# Patient Record
Sex: Male | Born: 1955 | Race: White | Hispanic: Refuse to answer | Marital: Married | Smoking: Current every day smoker
Health system: Southern US, Community
[De-identification: ages and names within clinical notes are randomized; demographics above are authoritative.]

## PROBLEM LIST (undated history)

## (undated) DIAGNOSIS — J189 Pneumonia, unspecified organism: Secondary | ICD-10-CM

## (undated) HISTORY — PX: FINGER SURGERY: SHX640

## (undated) HISTORY — DX: Pneumonia, unspecified organism: J18.9

## (undated) HISTORY — PX: HERNIA REPAIR: SHX51

---

## 2014-09-18 ENCOUNTER — Ambulatory Visit: Payer: Self-pay | Admitting: Surgery

## 2014-09-18 LAB — BASIC METABOLIC PANEL
ANION GAP: 5 — AB (ref 7–16)
BUN: 16 mg/dL (ref 7–18)
CALCIUM: 8.8 mg/dL (ref 8.5–10.1)
CO2: 29 mmol/L (ref 21–32)
CREATININE: 0.89 mg/dL (ref 0.60–1.30)
Chloride: 106 mmol/L (ref 98–107)
EGFR (Non-African Amer.): >60
Glucose: 99 mg/dL (ref 65–99)
Osmolality: 281 (ref 275–301)
POTASSIUM: 4.5 mmol/L (ref 3.5–5.1)
Sodium: 140 mmol/L (ref 136–145)

## 2014-09-18 LAB — CBC WITH DIFFERENTIAL/PLATELET
BASOS ABS: 0 10*3/uL (ref 0.0–0.1)
BASOS PCT: 0.5 %
EOS PCT: 3.8 %
Eosinophil #: 0.3 10*3/uL (ref 0.0–0.7)
HCT: 46.7 % (ref 40.0–52.0)
HGB: 15.5 g/dL (ref 13.0–18.0)
LYMPHS ABS: 2.2 10*3/uL (ref 1.0–3.6)
LYMPHS PCT: 28.5 %
MCH: 30.1 pg (ref 26.0–34.0)
MCHC: 33.1 g/dL (ref 32.0–36.0)
MCV: 91 fL (ref 80–100)
MONOS PCT: 7.7 %
Monocyte #: 0.6 x10 3/mm (ref 0.2–1.0)
Neutrophil #: 4.5 10*3/uL (ref 1.4–6.5)
Neutrophil %: 59.5 %
PLATELETS: 237 10*3/uL (ref 150–440)
RBC: 5.15 10*6/uL (ref 4.40–5.90)
RDW: 14.4 % (ref 11.5–14.5)
WBC: 7.6 10*3/uL (ref 3.8–10.6)

## 2014-09-27 ENCOUNTER — Ambulatory Visit: Payer: Self-pay | Admitting: Surgery

## 2015-02-16 NOTE — Op Note (Signed)
PATIENT NAME:  Bradley Harding, Cotton C MR#:  [id number] DATE OF BIRTH:  07-26-1956  DATE OF PROCEDURE:  09/27/2014  PREOPERATIVE DIAGNOSIS: Right inguinal hernia, recurrent.   POSTOPERATIVE DIAGNOSIS: Direct right inguinal hernia, recurrent.   PROCEDURE PERFORMED: Da Vinci robot-assisted laparoscopic right inguinal hernia repair with mesh.   SURGEON: Ida Roguehristopher Gerrianne Aydelott, MD  ANESTHESIA: General.   ESTIMATED BLOOD LOSS: Minimal.  COMPLICATIONS: None.   SPECIMENS: None.   INDICATION FOR SURGERY: Mr. Lovell SheehanJenkins a pleasant 59 year old male who has a history of right inguinal hernia repair, which was open, who presents with a right groin bulge and pain. He was brought to the operating room suite for robot-assisted laparoscopic inguinal hernia repair.   DETAILS OF PROCEDURE: Informed consent was obtained. Mr. Lovell SheehanJenkins was brought to the operating room suite. He was induced. Endotracheal tube was placed. General anesthesia was administered. His abdomen was prepped and draped in standard surgical fashion. A timeout was then performed to correctly identify patient name, operative site and procedure to be performed. A supraumbilical incision was made, deepened down to the fascia. The fascia was incised. Peritoneum was entered. Two stay sutures were placed in the fasciotomy. A Hassan trocar was placed in the abdomen, and the abdomen was insufflated. There were some adhesions around the umbilicus. Therefore, carefully an 8 mm Da Vinci trocar x2 were placed at approximately 8 cm lateral to the previous incision, at the umbilical line, on the left and right sides. An assistant port was then placed in the left upper quadrant between the lateral most and midline trocars. The robot was undocked. The peritoneum was then incised. I then looked at the left inguinal area and  there was no hernia. I then looked at the right and there was a large direct hernia in which the appendix had actually gone. The peritoneum was then  taken down until all hernia defects were visualized. I then placed a piece of 3-D mesh, medium-sized, in the defect. This was stapled to Cooper's and to the anterior abdominal wall. I then sutured the peritoneum to close the peritoneum over the mesh. Robot was then undocked. The supraumbilical fascia was closed with figure-of-eight 0 Vicryl. The 10 mm assistant trocar was then closed with figure-of-eight 0 Vicryl. The skin was then closed, after infiltration with 1% lidocaine, with interrupted 4-0 Monocryl deep dermal sutures. Steri-Strips, Telfa gauze and Tegaderm were then placed over the wound. The patient was then awoken, extubated and brought to the postanesthesia care unit. There were no immediate complications. Needle, sponge, and instrument count was correct at the end of the procedure.  ____________________________ Si Raiderhristopher A. Kaladin Noseworthy, MD cal:sb D: 09/27/2014 09:48:40 ET T: 09/27/2014 13:49:38 ET JOB#: [id number]  cc: Cristal Deerhristopher A. Jonty Morrical, MD, <Dictator> Jarvis NewcomerHRISTOPHER A Devita Nies MD ELECTRONICALLY SIGNED 10/01/2014 20:11

## 2016-05-01 ENCOUNTER — Institutional Professional Consult (permissible substitution): Payer: Self-pay | Admitting: Internal Medicine

## 2016-05-12 ENCOUNTER — Encounter: Payer: Self-pay | Admitting: Internal Medicine

## 2016-05-12 ENCOUNTER — Ambulatory Visit (INDEPENDENT_AMBULATORY_CARE_PROVIDER_SITE_OTHER): Payer: 59 | Admitting: Internal Medicine

## 2016-05-12 VITALS — BP 130/62 | HR 71 | Ht 74.0 in | Wt 149.0 lb

## 2016-05-12 DIAGNOSIS — Z716 Tobacco abuse counseling: Secondary | ICD-10-CM | POA: Insufficient documentation

## 2016-05-12 DIAGNOSIS — J984 Other disorders of lung: Secondary | ICD-10-CM

## 2016-05-12 DIAGNOSIS — R0600 Dyspnea, unspecified: Secondary | ICD-10-CM | POA: Diagnosis not present

## 2016-05-12 DIAGNOSIS — J941 Fibrothorax: Secondary | ICD-10-CM

## 2016-05-12 DIAGNOSIS — J90 Pleural effusion, not elsewhere classified: Secondary | ICD-10-CM | POA: Diagnosis not present

## 2016-05-12 DIAGNOSIS — J929 Pleural plaque without asbestos: Secondary | ICD-10-CM | POA: Insufficient documentation

## 2016-05-12 NOTE — Assessment & Plan Note (Signed)
I suspect this is a reactive pleural effusion. At this time he does have clinical improvement, so I do not think that it is actively infected. Based on the report is in the left lower lobe, size has not been tract arise. Given his clinical improvement, at this time there is no need for an emergent/urgent thoracentesis.  Plan: -Continue with supportive care -Further workup pending clinical status and review of CAT scan images from CD once it is available

## 2016-05-12 NOTE — Assessment & Plan Note (Signed)
Multifactorial: Recent pneumonia, recurrent upper respiratory tract infection, COPD  Patient states that his breathing is actually improving over the past couple weeks, he does not endorse any significant coughing or significant shortness of breath. 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  Plan: -Pulmonary function testing, 6 minute walk test -Albuterol as needed -Further recommendations based on PFTs

## 2016-05-12 NOTE — Assessment & Plan Note (Signed)
Right apical pleural thickening, most likely a sequelae of COPD/emphysema. Infection can also cause an inflammatory response such as this.  Plan: -Continue with supportive care and surveillance -Pulmonary function testing -6 minute walk test -Further workup pending review of CT images from CD

## 2016-05-12 NOTE — Assessment & Plan Note (Signed)
Differential includes: Infection, vasculitis, malignancy.  Based on CT reports, this may be an infective process as it such an acute finding over a two-month period. Malignancy is still in differential however I suspect bacterial infection/pneumonia may be the cause of his cavitary finding on his most recent CAT scan. He does have poor dentition and has not followed up with a dentist in over a year. Further history reveals that he continues to have recurrent upper spray tract infections around the same time for the past 3-4 years which is usually spring.  At this time I do not have access to the actual CT images to evaluate radiographically the cavitary lesion or the pleural effusion. I have requested electronic transfer of images to our system however this is not possible, and a physical CD will be obtained [facility].  Based on review of images, a bronchoscopy may be considered.  At this time the patient states that his breathing is almost back to baseline, he doesn't have any significant cough, fever, chills. Cavitary area in the left upper lobe may be a sequelae of his recent pneumonia again however we'll await images to be reviewed before any further invasive procedures are performed. At this time another CAT scan is not warranted as it will not show much difference.  Plan: -Supportive care -Follow-up with dentistry -Request for [facility] to obtain a CD of the images -Further workup pending review of images, once CD is delivered

## 2016-05-12 NOTE — Patient Instructions (Signed)
Follow up with Dr. Dema SeverinMungal in:6 weeks - PFTs and 6MWT prior to follow up visit - cut back on any tobacco use - avoid ecigs, vapors, etc - cont with as needed use of albuterol inhaler for sob/wheezing/coughing - I will review your CT chest images once he become available, and our office will call you about possible bronchoscopy or further evaluation for left upper lobe cavitary lesion and left-sided pleural effusion.

## 2016-05-12 NOTE — Progress Notes (Signed)
[facility] Pulmonary Medicine Consultation    Date: 05/12/2016  MRN# [id number] Bradley Harding 12/27/1955  Referring Physician: Dr. Vinson Moselle PMD - Dr. Patrice Paradise [name]. is a 60 y.o. old male seen in consultation for   CC:  Chief Complaint  Patient presents with  . pulmonary consult    pt ref by Dr. Vinson Moselle for pna. hosp visit to [facility]mo ago dx w/PNA. c/o lt rib pain, occ sob, occ prod cough w/clear mucus X3-66mo    HPI:  Patient is a pleasant 60 year old male seen in consultation for left upper lobe cavitary lesion and left sided pleural effusion. History as stated below, patient noted to have cough, shortness of breath, fever and chills for one month during April 2017, follow-up with his primary care physician diagnosed with [facility] pneumonia and placed on Levaquin and prednisone. He had mild improvement however in May he had a relapse of his symptoms with intense shortness of breath and left-sided chest wall pain, he presented to the ER chest x-ray showed left upper lobe pneumonia, CT of the chest showed left upper lobe consolidated pneumonia with left-sided pleural effusion, he was started on prednisone a Z-Pak at that time. In June 2017 he continue to follow-up his cough improved, it was no longer productive it was thin and clear, he continues to have some shortness of breath but again it is significantly improved. A follow-up chest CT was done that showed a left upper lobe cavitary lesion along with increasing left-sided pleural effusion. He was then referred to pulmonary for further workup and evaluation. He states he is a current smoker, he smoked different types of tobacco over his lifetime. He initially smoked cigarettes for about 15 years 1.5 packs per day quit in 1995, Dennis 2005 started smoking cigars about 1 cigar a day and quit in 2014. Finally in 2014 started smoking a pipe withdrawal tobacco, smoke about 4-5 times per day of his pipe. Today he denies any  significant shortness of breath, cough, weight loss, night sweats. He does endorse left-sided chest wall tenderness with deep inspiration.  Review of chart by Dr. Dema Severin PMD Visit 04/17/16 patient is seen today for follow-up of community-acquired pneumonia and pleuritic chest pain. Patient was initially seen in May 2017. His antibiotic regimen has included Zithromax, Levaquin and more recently Ceftin your antibiotic course. Follow-up chest CT revealed extending left lung consolidation and concern of ongoing infection, possible TB or atypical infection vs malignancy. He was referred to [facility] pulmonary for ASAP evaluation. Pt has not heard about pulmonary appt as of yet but he is feeling better. Cough has decreased in frequency and productivity and he is having no fever, night sweats and SOB is improved too. He is still smoking his pipe daily, up to 3 times a day. The pt states is eating more and back to normal and his weight has stablelized. The pt denies known exposure to TB. Negative PPD  Assessment/Plan:  Atypical pneumonia with worrisome findings on repeat Chest CT. He has been referred for ASAP pulmonary referral and we will check on progress of this referral. PPD placed today and will be read in 72 hrs. He is subjectively improved. He is strongly advised to wean from his current tobacco use.     PMHX:   Past Medical History  Diagnosis Date  . Pneumonia    Surgical Hx:  Past Surgical History  Procedure Laterality Date  . Finger surgery    . Hernia repair  Family Hx:  Family History  Problem Relation Age of Onset  . Lung cancer Mother   . Cancer Father    Social Hx:   Social History  Substance Use Topics  . Smoking status: Current Every Day Smoker -- 2 years    Types: Pipe  . Smokeless tobacco: None  . Alcohol Use: Yes     Comment: occ   Medication:   Current Outpatient Rx  Name  Route  Sig  Dispense  Refill  . PROAIR HFA 108 (90 Base) MCG/ACT inhaler                  Dispense as written.       Allergies:  Review of patient's allergies indicates not on file.  Review of Systems  Constitutional: Negative for fever, weight loss and malaise/fatigue.  HENT: Negative for sore throat.   Eyes: Negative for blurred vision and double vision.  Respiratory: Positive for cough and shortness of breath. Negative for wheezing and stridor.        Left chest wall tenderness with deep breathing  Cardiovascular: Negative for chest pain.  Gastrointestinal: Negative for heartburn.  Genitourinary: Negative for dysuria.  Musculoskeletal: Negative for myalgias.  Skin: Negative for rash.  Endo/Heme/Allergies: Does not bruise/bleed easily.  Psychiatric/Behavioral: Negative for depression.     Physical Examination:   VS: BP 130/62 mmHg  Pulse 71  Ht 6\' 2"  (1.88 m)  Wt 149 lb (67.586 kg)  BMI 19.12 kg/m2  SpO2 96%  General Appearance: No distress  Neuro:without focal findings, mental status, speech normal, alert and oriented, cranial nerves 2-12 intact, reflexes normal and symmetric, sensation grossly normal  HEENT: PERRLA, EOM intact, no ptosis, no other lesions noticed; Mallampati 2. Poor dentition Pulmonary: normal breath sounds., diaphragmatic excursion normal.No wheezing, No rales;   Sputum Production:  none CardiovascularNormal S1,S2.  No m/r/g.  Abdominal aorta pulsation normal.    Abdomen: Benign, Soft, non-tender, No masses, hepatosplenomegaly, No lymphadenopathy Renal:  No costovertebral tenderness  GU:  No performed at this time. Endoc: No evident thyromegaly, no signs of acromegaly or Cushing features Skin:   warm, no rashes, no ecchymosis  Extremities: normal, no cyanosis, clubbing, no edema, warm with normal capillary refill. Other findings:none   Labs results:   Rad results: (The following images and results were reviewed by Dr. Dema SeverinMungal on 05/12/2016). CXR 08/2014 EXAM:  CHEST 2 VIEW   COMPARISON: None.   FINDINGS:  Mediastinum and  hilar structures are normal. Mild pectus deformity.  No focal infiltrate. No pleural effusion or pneumothorax. No acute  bony abnormality.   IMPRESSION:  Mild pectus deformity, no acute cardiopulmonary disease.    CT Chest 04/13/2016 EXAM: CT Chest with contrast  DATE: 04/13/2016 8:16 AM ACCESSION: [id number] UN DICTATED: 04/13/2016 8:26 AM INTERPRETATION LOCATION: [facility]  CLINICAL INDICATION: 60 years old Male with -J18.9-CAP (community acquired pneumonia)  persistent pneumonia symptoms.  COMPARISON: CTA chest from 02/29/2016.  TECHNIQUE: A spiral CT scan was obtained with IV contrast from the thoracic inlet through the hemidiaphragms. Coronal and Sagittal reformatted images of the chest were also provided. 5mm and 2 mm reconstructed slices obtained.    FINDINGS:   AIRWAYS, LUNGS, PLEURA: Emphysema. Peribronchial thickening. Interval development of cavitary left upper lobe lesion with surrounding peripheral consolidations extending to the pleura Increased right apical pleural thickening is indeterminate. Increased retraction of the left hilum and left upper lobe volume loss.  Small left pleural effusion is new since 02/29/2016.  MEDIASTINUM: Normal heart size. The  left vertebral artery arises directly off the aortic arch. No pericardial effusion.  Enlarged right hilar node measures 1.9 cm.  IMAGED ABDOMEN: Unremarkable.  SOFT TISSUE: Unremarkable.  BONES: Unremarkable.   IMPRESSION: Interval development of cavitary left upper lobe lesion with surrounding peripheral consolidations extending to the pleura and increased left apical pleural thickening may represent ongoing infection. Consider tuberculosis, atypical mycobacterial infection, aspergillosis, and actinomycosis. Consider bronchial sampling.  Short-term follow-up chest CT is recommended to exclude neoplastic process.   New left pleural effusion.   CTA Chest 02/2016 EXAM: CTA Chest for Pulmonary  Embolus DATE: 02/29/2016 10:17 AM ACCESSION: [id number] UN DICTATED: 02/29/2016 10:36 AM INTERPRETATION LOCATION: [facility]  CLINICAL INDICATION: 60 years old Male with shortness of breath--    COMPARISON: Same-day chest radiograph  TECHNIQUE: A spiral CTA scan was obtained with IV contrast from the thoracic inlet through the hemidiaphragms. Images were reconstructed in the axial plane. Multiplanar reformatted and MIP images are provided for further evaluation of the pulmonary vasculature.  FINDINGS:  There are no filling defects within the pulmonary arterial tree to suggest a pulmonary embolism.  The heart and great vessels have an unremarkable enhanced appearance. Incidentally noted aortic arch origination of the left vertebral artery.  Subcentimeter mediastinal lymph nodes are nonspecific.  Emphysema. Peribronchial thickening.     Multiple impacted left upper lobe airways. Patchy consolidation throughout the left upper lobe.  There are no pleural or pericardial effusions.   The partially visualized upper abdomen has an unremarkable arterial enhanced appearance.   No acute osseous abnormality.    IMPRESSION: -No evidence for pulmonary embolus.  Multiple impacted left upper lobe airways. Patchy consolidation throughout the left upper lobe. Favor pneumonia.   Subcentimeter mediastinal nodes are likely reactive. Follow-up chest CT to confirm resolution in 4-6 weeks.  -Diffuse emphysematous changes. Peribronchial thickening.     Assessment and Plan: 60 year old male past medical history of COPD, tobacco abuse, seen in consultation for left upper lobe cavitary lung lesion along with left pleural effusion Tobacco abuse counseling Tobacco Cessation - Counseling regarding benefits of smoking cessation strategies was provided for more than 12 min. - Educated that at this time smoking- cessation represents the single most important step that patient can take to enhance the length and  quality of live. - Educated patient regarding alternatives of behavior interventions, pharmacotherapy including NRT and non-nicotine therapy such, and combinations of both. - Patient at this time: is smoking pipes, with raw tobacco, he is planning to cut back and quit.     Cavitary lesion of lung Differential includes: Infection, vasculitis, malignancy.  Based on CT reports, this may be an infective process as it such an acute finding over a two-month period. Malignancy is still in differential however I suspect bacterial infection/pneumonia may be the cause of his cavitary finding on his most recent CAT scan. He does have poor dentition and has not followed up with a dentist in over a year. Further history reveals that he continues to have recurrent upper spray tract infections around the same time for the past 3-4 years which is usually spring.  At this time I do not have access to the actual CT images to evaluate radiographically the cavitary lesion or the pleural effusion. I have requested electronic transfer of images to our system however this is not possible, and a physical CD will be obtained from the [facility] system.  Based on review of images, a bronchoscopy may be considered.  At this time the patient states that his  breathing is almost back to baseline, he doesn't have any significant cough, fever, chills. Cavitary area in the left upper lobe may be a sequelae of his recent pneumonia again however we'll await images to be reviewed before any further invasive procedures are performed. At this time another CAT scan is not warranted as it will not show much difference.  Plan: -Supportive care -Follow-up with dentistry -Request for release of medical records from [facility] to obtain a CD of the images -Further workup pending review of images, once CD is delivered  Dyspnea Multifactorial: Recent pneumonia, recurrent upper respiratory tract infection, COPD  Patient states that his  breathing is actually improving over the past couple weeks, he does not endorse any significant coughing or significant shortness of breath. 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  Plan: -Pulmonary function testing, 6 minute walk test -Albuterol as needed -Further recommendations based on PFTs  Pleural thickening Right apical pleural thickening, most likely a sequelae of COPD/emphysema. Infection can also cause an inflammatory response such as this.  Plan: -Continue with supportive care and surveillance -Pulmonary function testing -6 minute walk test -Further workup pending review of CT images from CD  Pleural effusion I suspect this is a reactive pleural effusion. At this time he does have clinical improvement, so I do not think that it is actively infected. Based on the report is in the left lower lobe, size has not been tract arise. Given his clinical improvement, at this time there is no need for an emergent/urgent thoracentesis.  Plan: -Continue with supportive care -Further workup pending clinical status and review of CAT scan images from CD once it is available    Updated Medication List Outpatient Encounter Prescriptions as of 05/12/2016  Medication Sig  . PROAIR HFA 108 (90 Base) MCG/ACT inhaler    No facility-administered encounter medications on file as of 05/12/2016.    Orders for this visit: Orders Placed This Encounter  Procedures  . Pulmonary function test    Standing Status: Future     Number of Occurrences:      Standing Expiration Date: 05/12/2017    Order Specific Question:  Where should this test be performed?    Answer:  [facility]    Order Specific Question:  Full PFT: includes the following: basic spirometry, spirometry pre & post bronchodilator, diffusion capacity (DLCO), lung volumes    Answer:  Full PFT  . 6 minute walk    Standing Status: Future     Number of  Occurrences:      Standing Expiration Date: 05/12/2017    Order Specific Question:  Where should this test be performed?    Answer:  Other     Thank  you for the consultation and for allowing [facility] to assist in the care of your patient. Our recommendations are noted above.  Please contact us if we can be of further service.   Stephanie Acre, MD [facility] Office Number: [phone]  Note: This note was prepared with Dragon dictation along with smaller phrase technology. Any transcriptional errors that result from this process are unintentional.

## 2016-05-12 NOTE — Assessment & Plan Note (Signed)
Tobacco Cessation - Counseling regarding benefits of smoking cessation strategies was provided for more than 12 min. - Educated that at this time smoking- cessation represents the single most important step that patient can take to enhance the length and quality of live. - Educated patient regarding alternatives of behavior interventions, pharmacotherapy including NRT and non-nicotine therapy such, and combinations of both. - Patient at this time: is smoking pipes, with raw tobacco, he is planning to cut back and quit.

## 2016-06-16 ENCOUNTER — Ambulatory Visit (INDEPENDENT_AMBULATORY_CARE_PROVIDER_SITE_OTHER): Payer: 59 | Admitting: *Deleted

## 2016-06-16 ENCOUNTER — Ambulatory Visit: Payer: 59

## 2016-06-16 DIAGNOSIS — R0600 Dyspnea, unspecified: Secondary | ICD-10-CM

## 2016-06-16 DIAGNOSIS — J90 Pleural effusion, not elsewhere classified: Secondary | ICD-10-CM

## 2016-06-16 LAB — PULMONARY FUNCTION TEST
DL/VA % pred: 75 %
DL/VA: 3.64 ml/min/mmHg/L
DLCO UNC % PRED: 56 %
DLCO unc: 21.13 ml/min/mmHg
FEF 25-75 PRE: 0.8 L/s
FEF 25-75 Post: 1.14 L/sec
FEF2575-%Change-Post: 43 %
FEF2575-%Pred-Post: 34 %
FEF2575-%Pred-Pre: 23 %
FEV1-%Change-Post: 15 %
FEV1-%Pred-Post: 47 %
FEV1-%Pred-Pre: 41 %
FEV1-Post: 1.96 L
FEV1-Pre: 1.69 L
FEV1FVC-%Change-Post: 1 %
FEV1FVC-%Pred-Pre: 68 %
FEV6-%CHANGE-POST: 14 %
FEV6-%PRED-PRE: 61 %
FEV6-%Pred-Post: 69 %
FEV6-Post: 3.64 L
FEV6-Pre: 3.19 L
FEV6FVC-%Change-Post: 0 %
FEV6FVC-%PRED-POST: 101 %
FEV6FVC-%PRED-PRE: 101 %
FVC-%CHANGE-POST: 14 %
FVC-%Pred-Post: 68 %
FVC-%Pred-Pre: 59 %
FVC-Post: 3.74 L
FVC-Pre: 3.26 L
POST FEV1/FVC RATIO: 53 %
POST FEV6/FVC RATIO: 97 %
Pre FEV1/FVC ratio: 52 %
Pre FEV6/FVC Ratio: 98 %
RV % PRED: 179 %
RV: 4.42 L
TLC % PRED: 100 %
TLC: 7.8 L

## 2016-06-16 NOTE — Progress Notes (Signed)
SMW performed today. 

## 2016-06-16 NOTE — Progress Notes (Signed)
PFT performed today. 

## 2016-06-23 ENCOUNTER — Encounter: Payer: Self-pay | Admitting: Internal Medicine

## 2016-06-23 ENCOUNTER — Ambulatory Visit (INDEPENDENT_AMBULATORY_CARE_PROVIDER_SITE_OTHER): Payer: 59 | Admitting: Internal Medicine

## 2016-06-23 VITALS — BP 126/74 | HR 71 | Ht 74.0 in | Wt 145.2 lb

## 2016-06-23 DIAGNOSIS — R0600 Dyspnea, unspecified: Secondary | ICD-10-CM | POA: Diagnosis not present

## 2016-06-23 DIAGNOSIS — J984 Other disorders of lung: Secondary | ICD-10-CM | POA: Diagnosis not present

## 2016-06-23 DIAGNOSIS — J449 Chronic obstructive pulmonary disease, unspecified: Secondary | ICD-10-CM | POA: Insufficient documentation

## 2016-06-23 DIAGNOSIS — Z716 Tobacco abuse counseling: Secondary | ICD-10-CM | POA: Diagnosis not present

## 2016-06-23 MED ORDER — GLYCOPYRROLATE-FORMOTEROL 9-4.8 MCG/ACT IN AERO: 2.0000 | INHALATION_SPRAY | Freq: Two times a day (BID) | RESPIRATORY_TRACT | 0 refills | Status: AC

## 2016-06-23 NOTE — Assessment & Plan Note (Signed)
Patient with severe obstruction noted on his primary function testing, FEV1/FVC 52%, FEV1 41% is also notable hyperinflation and air trapping. Today we started the patient on a LABA/LAMA - Bevespi Given his clinical symptoms and his level of obstruction he is a stage II or III COPD patient.  Plan: -Bevespi - 2 puff BID. -gargle and rinse after each use.

## 2016-06-23 NOTE — Assessment & Plan Note (Signed)
Differential includes: Infection, vasculitis, malignancy.  Based on CT reports, this may be an infective process as it such an acute finding over a two-month period. Malignancy is still in differential however I suspect bacterial infection/pneumonia may be the cause of his cavitary finding on his most recent CAT scan. He does have poor dentition and has not followed up with a dentist in over a year.  CT chest with LUL cavitary lesion in 03/2016 Patient with CD today, images reviewed, LUL cavitary lesion with small L pleural effusion. Given clinical improvement, no bronchoscopy at this, but will require a follow Chest CT  Plan: -Supportive care -Follow-up with dentistry -CT chest w/o contrast prior to follow up visit.

## 2016-06-23 NOTE — Assessment & Plan Note (Signed)
Tobacco Cessation - Counseling regarding benefits of smoking cessation strategies was provided for more than 12 min. - Educated that at this time smoking- cessation represents the single most important step that patient can take to enhance the length and quality of live. - Educated patient regarding alternatives of behavior interventions, pharmacotherapy including NRT and non-nicotine therapy such, and combinations of both. - Patient at this time: is smoking pipes, with raw tobacco, he is planning to cut back and quit.    

## 2016-06-23 NOTE — Patient Instructions (Addendum)
Follow up with Dr. Dema SeverinMungal in:6 weeks -Follow-up with dentistry -CT chest w/o contrast prior to follow up visit.  -Bevespi - 2 puff BID - -gargle and rinse after each use.  - cut back and stop smoking

## 2016-06-23 NOTE — Assessment & Plan Note (Signed)
Multifactorial: Recent pneumonia, recurrent upper respiratory tract infection, COPD  Patient states that his breathing is actually improving over the past couple weeks, he does not endorse any significant coughing or significant shortness of breath. 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  Plan: -Albuterol as needed - see Plan for COPD

## 2016-06-23 NOTE — Progress Notes (Signed)
[name]      MRN# [id number] Bradley Harding. Feb 19, 1956   CC: Chief Complaint  Patient presents with  . Follow-up    review PFT and 6mw.  Pt states he is much improved since last OV, dyspnea has improved.        Brief History: 60 yo former tobacco smoker, now smokes cigars and pipe, noted to have community acquired pneumonia with left upper lobe cavitary lesion. FEV1 41%, FEV1/FVC 52%. Started on trial of Bevespi   Events since last clinic visit: patient presented today for follow-up visit of his left upper lobe cavitary lesion. He had a primary function tests and 6 minute walk test performed. He also brought CD often binges from [facility]. CD images reviewed, there is left upper lobe cavitary lesion with some mild left sided pleural thickening and small left pleural effusion. He states he is doing a lot better, was back to baseline. He does admit to a mild cough intermittent at times with whitish sputum production. He does not have any significant shortness of breath, fever, chills, nausea.    Current Outpatient Prescriptions:  .  PROAIR HFA 108 (90 Base) MCG/ACT inhaler, Inhale 1-2 puffs into the lungs every 6 (six) hours as needed. , Disp: , Rfl:    Review of Systems  Constitutional: Negative for chills, fever and weight loss.  HENT: Negative for hearing loss.   Eyes: Negative for blurred vision.  Respiratory: Positive for cough and sputum production. Negative for shortness of breath and wheezing.   Cardiovascular: Negative for chest pain.  Gastrointestinal: Negative for heartburn and nausea.  Genitourinary: Negative for dysuria.  Skin: Negative for itching and rash.  Neurological: Negative for headaches.      Allergies:  Review of patient's allergies indicates not on file.  Physical Examination:  VS: BP 126/74 (BP Location: Right Arm, Cuff Size: Normal)   Pulse 71   Ht 6\' 2"  (1.88 m)   Wt 145 lb 3.2 oz (65.9 kg)    SpO2 97%   BMI 18.64 kg/m   General Appearance: No distress  HEENT: PERRLA, no ptosis, no other lesions noticed Pulmonary:normal breath sounds., diaphragmatic excursion normal.No wheezing, No rales   Cardiovascular:  Normal S1,S2.  No m/r/g.     Abdomen:Exam: Benign, Soft, non-tender, No masses  Skin:   warm, no rashes, no ecchymosis  Extremities: normal, no cyanosis, clubbing, warm with normal capillary refill.     Pulmonary function testing 06/16/16 FEV1 41% FEV1/FVC 52% RV 179 TLC 100 RV/TLC 171 DLCO uncorrected 56% Flow loops prolonged scoping of the expiratory phase consistent with obstruction Impression: Severe obstruction with hyperinflation and air trapping and significant response to bronchodilators. Findings consistent with obstructive process such as COPD   6 minute walk test: No desaturations total distance 401 m  Assessment and Plan: 60 year old male past medical history of tobacco abuse, COPD, seen in follow-up for recent community acquired pneumonia with sequelae of left upper lobe cavitary lesion being followed closely. Cavitary lesion of lung Differential includes: Infection, vasculitis, malignancy.  Based on CT reports, this may be an infective process as it such an acute finding over a two-month period. Malignancy is still in differential however I suspect bacterial infection/pneumonia may be the cause of his cavitary finding on his most recent CAT scan. He does have poor dentition and has not followed up with a dentist in over a year.  CT chest with LUL cavitary lesion in 03/2016 Patient with CD today,  images reviewed, LUL cavitary lesion with small L pleural effusion. Given clinical improvement, no bronchoscopy at this, but will require a follow Chest CT  Plan: -Supportive care -Follow-up with dentistry -CT chest w/o contrast prior to follow up visit.    COPD mixed type [facility](HCC) Patient with severe obstruction noted on his primary function testing,  FEV1/FVC 52%, FEV1 41% is also notable hyperinflation and air trapping. Today we started the patient on a LABA/LAMA - Bevespi Given his clinical symptoms and his level of obstruction he is a stage II or III COPD patient.  Plan: -Bevespi - 2 puff BID. -gargle and rinse after each use.    Dyspnea Multifactorial: Recent pneumonia, recurrent upper respiratory tract infection, COPD  Patient states that his breathing is actually improving over the past couple weeks, he does not endorse any significant coughing or significant shortness of breath. 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  Plan: -Albuterol as needed - see Plan for COPD  Tobacco abuse counseling Tobacco Cessation - Counseling regarding benefits of smoking cessation strategies was provided for more than 12 min. - Educated that at this time smoking- cessation represents the single most important step that patient can take to enhance the length and quality of live. - Educated patient regarding alternatives of behavior interventions, pharmacotherapy including NRT and non-nicotine therapy such, and combinations of both. - Patient at this time: is smoking pipes, with raw tobacco, he is planning to cut back and quit.      Updated Medication List Outpatient Encounter Prescriptions as of 06/23/2016  Medication Sig  . PROAIR HFA 108 (90 Base) MCG/ACT inhaler Inhale 1-2 puffs into the lungs every 6 (six) hours as needed.    No facility-administered encounter medications on file as of 06/23/2016.     Orders for this visit: Orders Placed This Encounter  Procedures  . CT CHEST WO CONTRAST    Standing Status:   Future    Standing Expiration Date:   08/23/2017    Order Specific Question:   Reason for Exam (SYMPTOM  OR DIAGNOSIS REQUIRED)    Answer:   NODULE    Order Specific Question:   Preferred imaging location?    Answer:   [facility]    Thank  you for the  visitation and for allowing  [name] to assist in the care of your patient. Our recommendations are noted above.  Please contact us if we can be of further service.  Stephanie AcreVishal Kol Consuegra, MD [facility] Office Number: [phone]  Note: This note was prepared with Dragon dictation along with smaller phrase technology. Any transcriptional errors that result from this process are unintentional.

## 2016-07-29 ENCOUNTER — Telehealth: Payer: Self-pay

## 2016-07-29 MED ORDER — GLYCOPYRROLATE-FORMOTEROL 9-4.8 MCG/ACT IN AERO: 2.0000 | INHALATION_SPRAY | Freq: Two times a day (BID) | RESPIRATORY_TRACT | 5 refills | Status: AC

## 2016-07-29 NOTE — Telephone Encounter (Signed)
Pt is requesting Bevespi refill to [facility]

## 2016-07-29 NOTE — Telephone Encounter (Signed)
RX sent to [facility]. Nothing further needed.

## 2016-08-11 ENCOUNTER — Ambulatory Visit
Admission: RE | Admit: 2016-08-11 | Discharge: 2016-08-11 | Disposition: A | Discharge status: Home / Self Care | Payer: 59 | Source: Ambulatory Visit | Attending: Internal Medicine | Admitting: Internal Medicine

## 2016-08-11 DIAGNOSIS — R911 Solitary pulmonary nodule: Secondary | ICD-10-CM | POA: Insufficient documentation

## 2016-08-11 DIAGNOSIS — J984 Other disorders of lung: Secondary | ICD-10-CM

## 2016-08-18 ENCOUNTER — Ambulatory Visit (INDEPENDENT_AMBULATORY_CARE_PROVIDER_SITE_OTHER): Payer: 59 | Admitting: Internal Medicine

## 2016-08-18 ENCOUNTER — Encounter: Payer: Self-pay | Admitting: Internal Medicine

## 2016-08-18 VITALS — BP 116/70 | HR 67 | Ht 74.0 in | Wt 151.0 lb

## 2016-08-18 DIAGNOSIS — F17290 Nicotine dependence, other tobacco product, uncomplicated: Secondary | ICD-10-CM

## 2016-08-18 DIAGNOSIS — J984 Other disorders of lung: Secondary | ICD-10-CM | POA: Diagnosis not present

## 2016-08-18 DIAGNOSIS — Z23 Encounter for immunization: Secondary | ICD-10-CM

## 2016-08-18 DIAGNOSIS — J449 Chronic obstructive pulmonary disease, unspecified: Secondary | ICD-10-CM | POA: Diagnosis not present

## 2016-08-18 NOTE — Progress Notes (Signed)
[facility] Pulmonary Medicine Consultation      MRN# [id number] Bradley Harding. [birth year]   CC: Chief Complaint  Patient presents with  . Follow-up    CT results: SOB w/some exertion mainly in am; prod cough w/clear mucyus mainly in am:       Brief History: 60 yo former tobacco smoker, now smokes cigars and pipe, noted to have community acquired pneumonia with left upper lobe cavitary lesion. FEV1 41%, FEV1/FVC 52%. Started on trial of Bevespi   Events since last clinic visit: patient presented today for follow-up visit of his left upper lobe cavitary lesion. Since his last visit he states that he has been doing well. Stop using Ecigs, but still smoking the pipe "occasionally." Admits to mild sob (chronic) and productive (clear) cough mostly in the AM.  Followed up with Dentistry had one middle tooth pulled (lower).     Current Outpatient Prescriptions:  Marland Kitchen  Glycopyrrolate-Formoterol (BEVESPI AEROSPHERE) 9-4.8 MCG/ACT AERO, Inhale 2 puffs into the lungs 2 (two) times daily., Disp: 1 Inhaler, Rfl: 5 .  PROAIR HFA 108 (90 Base) MCG/ACT inhaler, Inhale 1-2 puffs into the lungs every 6 (six) hours as needed. , Disp: , Rfl:    Review of Systems  Constitutional: Negative for chills, fever and weight loss.  HENT: Negative for hearing loss.   Eyes: Negative for blurred vision.  Respiratory: Positive for cough and sputum production. Negative for shortness of breath and wheezing.   Cardiovascular: Negative for chest pain.  Gastrointestinal: Negative for heartburn and nausea.  Genitourinary: Negative for dysuria.  Skin: Negative for itching and rash.  Neurological: Negative for headaches.      Allergies:  Review of patient's allergies indicates no known allergies.  Physical Examination:  VS: BP 116/70 (BP Location: Left Arm, Cuff Size: Normal)   Pulse 67   Ht 6\' 2"  (1.88 m)   Wt 151 lb (68.5 kg)   SpO2 99%   BMI 19.39 kg/m   General Appearance: No distress    HEENT: PERRLA, no ptosis, no other lesions noticed Pulmonary:normal breath sounds., diaphragmatic excursion normal.No wheezing, No rales   Cardiovascular:  Normal S1,S2.  No m/r/g.     Abdomen:Exam: Benign, Soft, non-tender, No masses  Skin:   warm, no rashes, no ecchymosis  Extremities: normal, no cyanosis, clubbing, warm with normal capillary refill.     Pulmonary function testing 06/16/16 FEV1 41% FEV1/FVC 52% RV 179 TLC 100 RV/TLC 171 DLCO uncorrected 56% Flow loops prolonged scoping of the expiratory phase consistent with obstruction Impression: Severe obstruction with hyperinflation and air trapping and significant response to bronchodilators. Findings consistent with obstructive process such as COPD   6 minute walk test: No desaturations total distance 401 m   (The following images and results were reviewed by Dr. Dema Severin on 08/18/2016). CT Chest 10/17: CT CHEST WITHOUT CONTRAST  TECHNIQUE: Multidetector CT imaging of the chest was performed following the standard protocol without IV contrast.  COMPARISON:  Chest radiographs dated 09/19/1999 50  FINDINGS: Cardiovascular: The heart is normal in size. No pericardial effusion.  Mild ectasia of the ascending thoracic aorta, measuring 3.4 cm. Mild atherosclerotic calcifications.  Mediastinum/Nodes: No suspicious mediastinal lymphadenopathy.  Visualized thyroid is unremarkable.  Lungs/Pleura: 2.0 x 1.8 cm cavitary lesion in the left upper lobe (series 3/ image 35), with a mildly thickened wall but no solid components. Surrounding scarring in the left upper lobe.  Biapical pleural-parenchymal scarring, left greater than right.  Underlying moderate to severe centrilobular emphysematous changes, upper  lobe predominant.  Trace left pleural effusion.  No pneumothorax.  Upper Abdomen: Visualized upper abdomen is unremarkable.  Musculoskeletal: Visualized osseous structures are within  normal limits.  IMPRESSION: 2.0 x 1.8 cm cavitary lesion in the left upper lobe, with a mildly thickened wall but without solid components. Consider a follow-up CT chest in 3-6 months to assess for stability.    Assessment and Plan: 60 year old male past medical history of tobacco abuse, COPD, seen in follow-up for recent community acquired pneumonia with sequelae of left upper lobe cavitary lesion being followed closely. Cavitary lesion of lung Differential includes: Infection, vasculitis, malignancy.  Based on CT reports, this may be an infective process as it such an acute finding over a two-month period. Malignancy is still in differential however I suspect bacterial infection/pneumonia may be the cause of his cavitary finding on his most recent CAT scan. He does have poor dentition and has not followed up with a dentist in over a year.  CT chest with LUL cavitary lesion in 03/2016, CT today with mild thick walled LUL lesion, decreasing in size. Given smoking history still at risk for malignancy.  Given clinical improvement, no bronchoscopy at this time, but will require a follow Chest CT with contrast Has followed up with Dentistry, had one tooth pulled (lower mandible, one of the central incisors), feeling much better.   Plan: -Supportive care -CT chest with contrast prior to follow up visit.    COPD mixed type [facility]) Patient with severe obstruction noted on his primary function testing, FEV1/FVC 52%, FEV1 41% is also notable hyperinflation and air trapping. Cont with  Bevespi Given his clinical symptoms and his level of obstruction he is a stage II or III COPD patient.  Plan: -Bevespi - 2 puff BID. -gargle and rinse after each use.    Cigar smoker Cigar Cessation - Counseling regarding benefits of smoking cessation strategies was provided for more than 3 min. - Educated that at this time smoking- cessation represents the single most important step that patient can take to  enhance the length and quality of live. - Educated patient regarding alternatives of behavior interventions, pharmacotherapy including NRT and non-nicotine therapy such, and combinations of both. - Patient at this time is trying to quit on his own, he has stopped using Ecig, but still with the pipe.      Updated Medication List Outpatient Encounter Prescriptions as of 08/18/2016  Medication Sig  . Glycopyrrolate-Formoterol (BEVESPI AEROSPHERE) 9-4.8 MCG/ACT AERO Inhale 2 puffs into the lungs 2 (two) times daily.  Marland Kitchen PROAIR HFA 108 (90 Base) MCG/ACT inhaler Inhale 1-2 puffs into the lungs every 6 (six) hours as needed.    No facility-administered encounter medications on file as of 08/18/2016.     Orders for this visit: Orders Placed This Encounter  Procedures  . CT CHEST W CONTRAST    Standing Status:   Future    Standing Expiration Date:   10/18/2017    Order Specific Question:   If indicated for the ordered procedure, I authorize the administration of contrast media per Radiology protocol    Answer:   Yes    Order Specific Question:   Reason for Exam (SYMPTOM  OR DIAGNOSIS REQUIRED)    Answer:   Cavitary LUL lesion    Order Specific Question:   Preferred imaging location?    Answer:   [facility]  . Flu Vaccine QUAD 36+ mos IM (Fluarix & Fluzone Quad PF    Thank  you for the  visitation and for allowing  [facility] to assist in the care of your patient. Our recommendations are noted above.  Please contact us if we can be of further service.  Stephanie AcreVishal Braison Snoke, MD [facility] Office Number: [phone]  Note: This note was prepared with Dragon dictation along with smaller phrase technology. Any transcriptional errors that result from this process are unintentional.

## 2016-08-18 NOTE — Patient Instructions (Addendum)
Follow up in 3 months with Dr. Belia HemanKasa - Flu shot today - Repeat CT Chest with Contrast in 3 months for Cavitary LUL lesion - cont with current inhalers - cut back on smoking and eventually stop - [facility]

## 2016-08-18 NOTE — Assessment & Plan Note (Signed)
Differential includes: Infection, vasculitis, malignancy.  Based on CT reports, this may be an infective process as it such an acute finding over a two-month period. Malignancy is still in differential however I suspect bacterial infection/pneumonia may be the cause of his cavitary finding on his most recent CAT scan. He does have poor dentition and has not followed up with a dentist in over a year.  CT chest with LUL cavitary lesion in 03/2016, CT today with mild thick walled LUL lesion, decreasing in size. Given smoking history still at risk for malignancy.  Given clinical improvement, no bronchoscopy at this time, but will require a follow Chest CT with contrast Has followed up with Dentistry, had one tooth pulled (lower mandible, one of the central incisors), feeling much better.   Plan: -Supportive care -CT chest with contrast prior to follow up visit.

## 2016-08-18 NOTE — Assessment & Plan Note (Signed)
Patient with severe obstruction noted on his primary function testing, FEV1/FVC 52%, FEV1 41% is also notable hyperinflation and air trapping. Cont with  Bevespi Given his clinical symptoms and his level of obstruction he is a stage II or III COPD patient.  Plan: -Bevespi - 2 puff BID. -gargle and rinse after each use.

## 2016-08-18 NOTE — Assessment & Plan Note (Signed)
Cigar Cessation - Counseling regarding benefits of smoking cessation strategies was provided for more than 3 min. - Educated that at this time smoking- cessation represents the single most important step that patient can take to enhance the length and quality of live. - Educated patient regarding alternatives of behavior interventions, pharmacotherapy including NRT and non-nicotine therapy such, and combinations of both. - Patient at this time is trying to quit on his own, he has stopped using Ecig, but still with the pipe.

## 2016-11-02 ENCOUNTER — Other Ambulatory Visit: Payer: Self-pay | Admitting: *Deleted

## 2016-11-02 DIAGNOSIS — J984 Other disorders of lung: Secondary | ICD-10-CM

## 2017-08-05 IMAGING — CT CT CHEST W/O CM
2 of 3 series · 15 of 36 positions shown, 18 images · non-contrast
Comparison: Chest radiographs dated 09/19/1999 50

CLINICAL DATA: Cavitary lung nodule

EXAM:
CT CHEST WITHOUT CONTRAST
TECHNIQUE: Multidetector CT imaging of the chest was performed following the
standard protocol without IV contrast.

[Series 2: thorax · axial · 0.63mm/px · z∈[+469,+745]mm · 12 of 163 slices shown, 15 images]
[im 13/163  mediastinal]
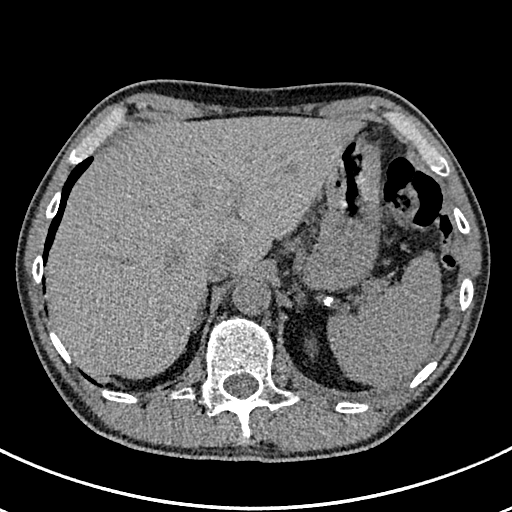
[im 13/163  lung]
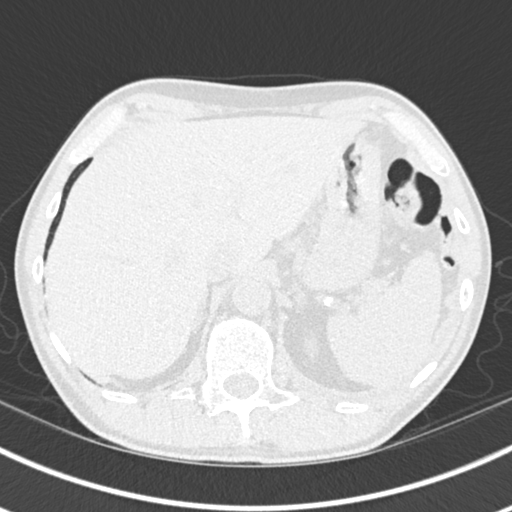
[im 25/163  lung]
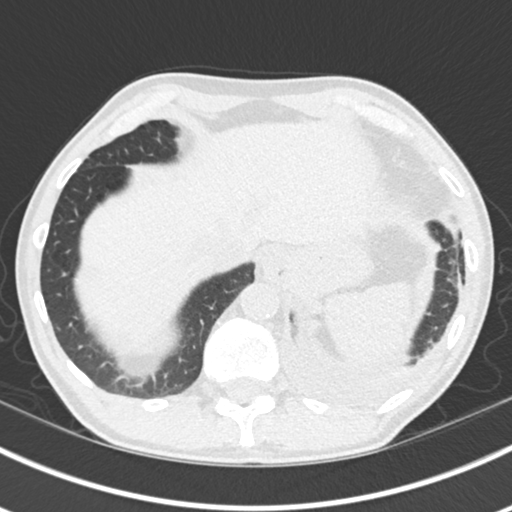
[im 37/163  lung]
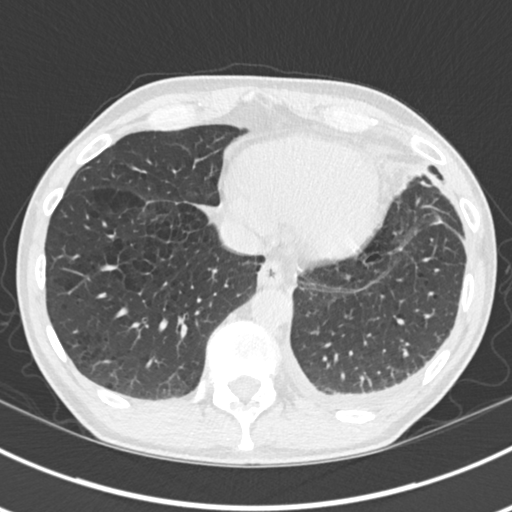
[im 49/163  lung]
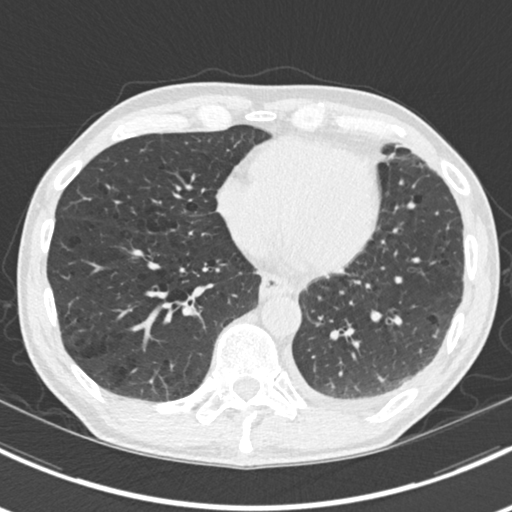
[im 61/163  mediastinal]
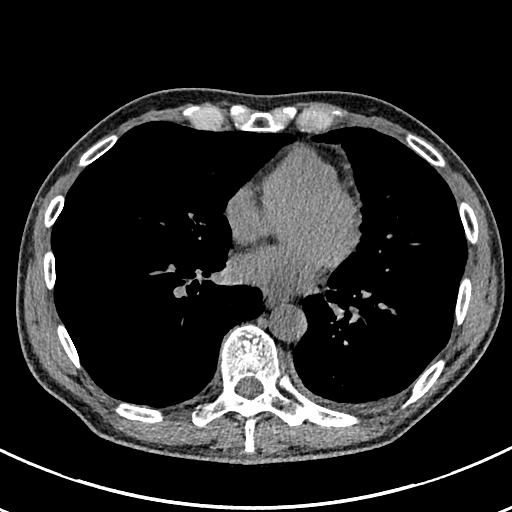
[im 61/163  lung]
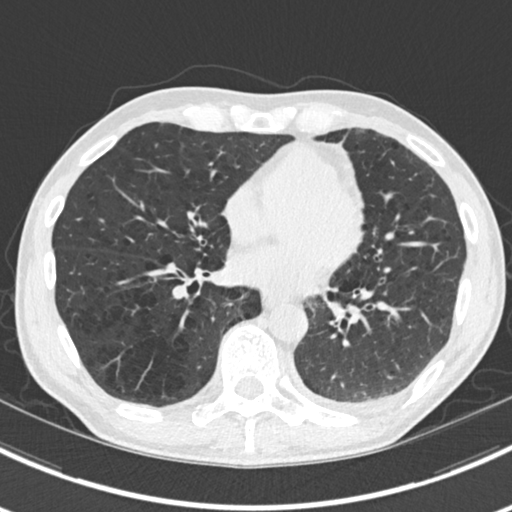
[im 73/163  lung]
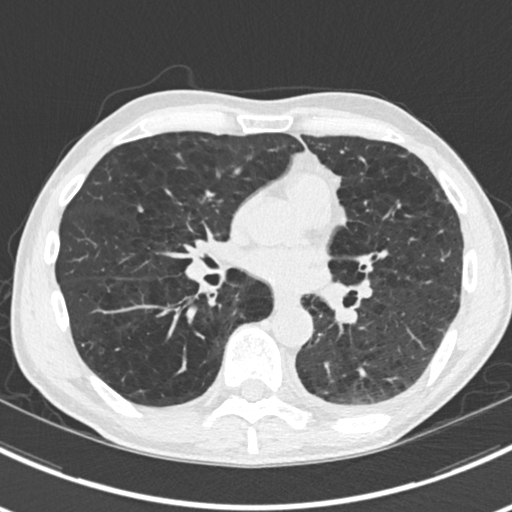
[im 91/163  lung]
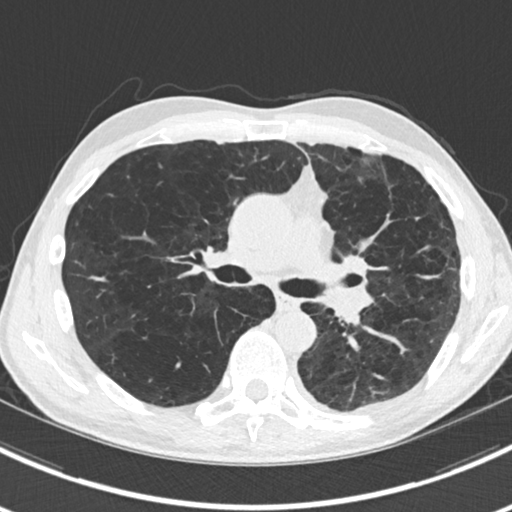
[im 103/163  lung]
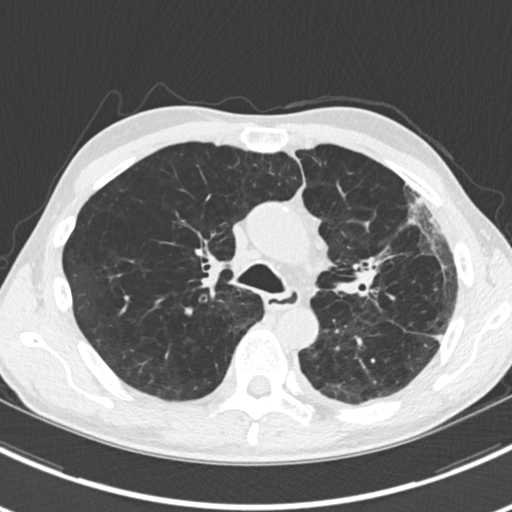
[im 115/163  mediastinal]
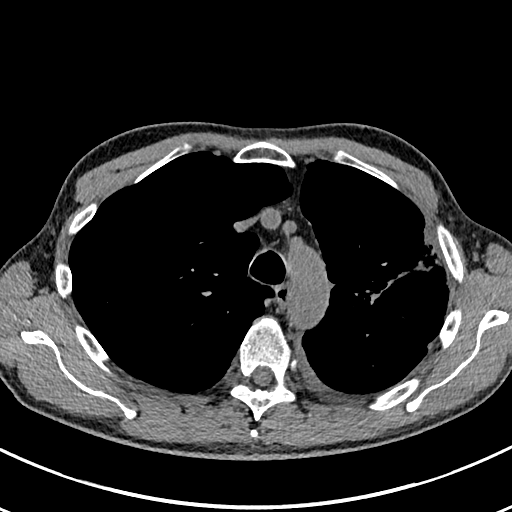
[im 115/163  lung]
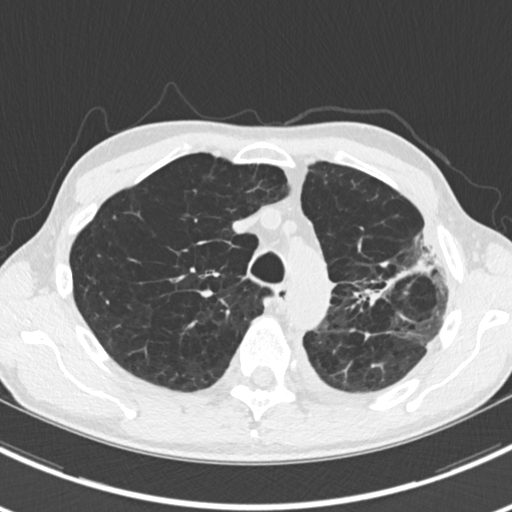
[im 127/163  lung]
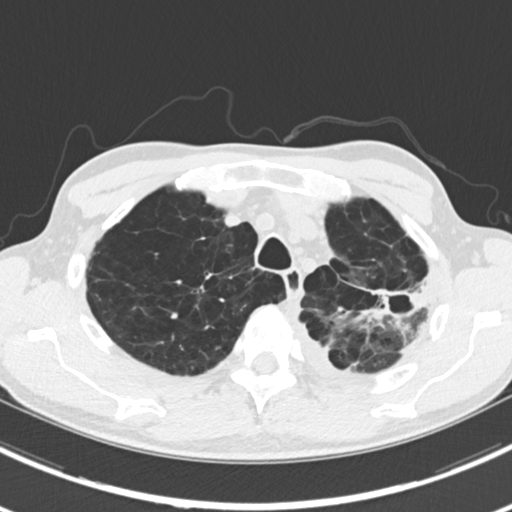
[im 139/163  lung]
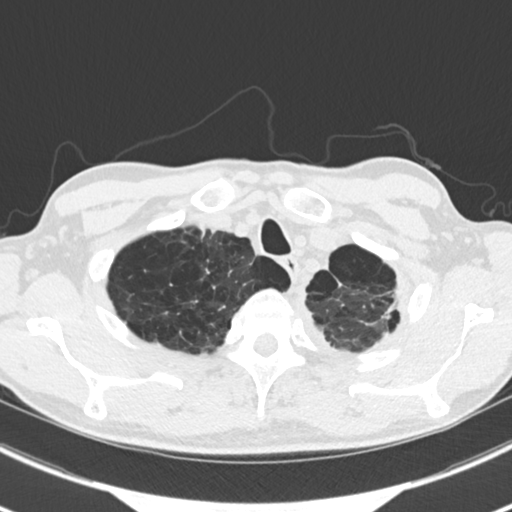
[im 151/163  lung]
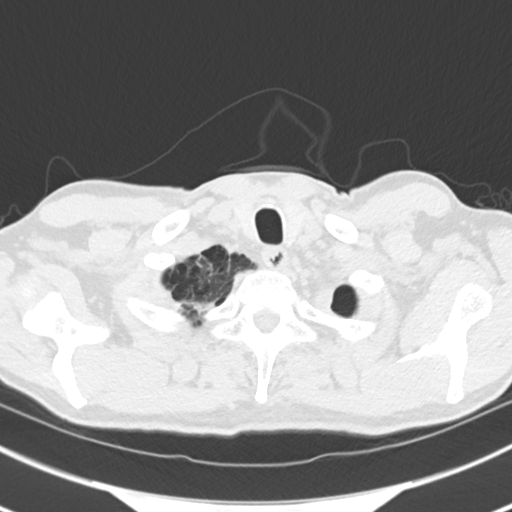

[Series 5: coronal · coronal · 0.62mm/px · 3 of 121 slices shown]
[im 25/121  lung]
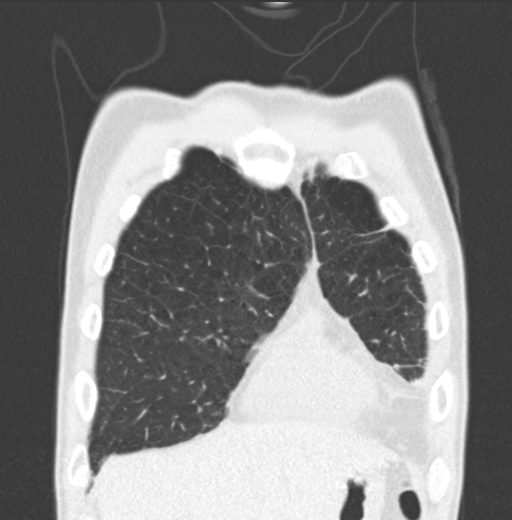
[im 49/121  lung]
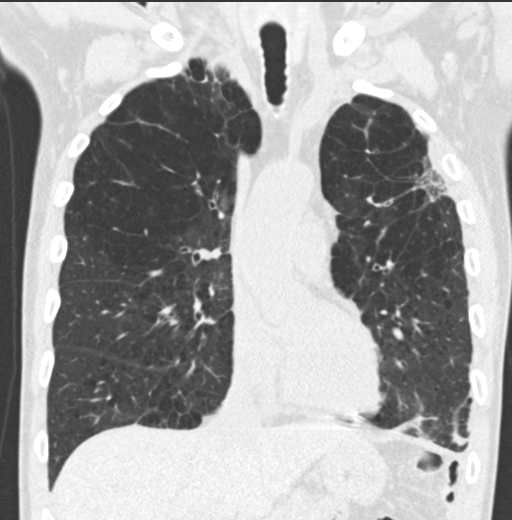
[im 73/121  lung]
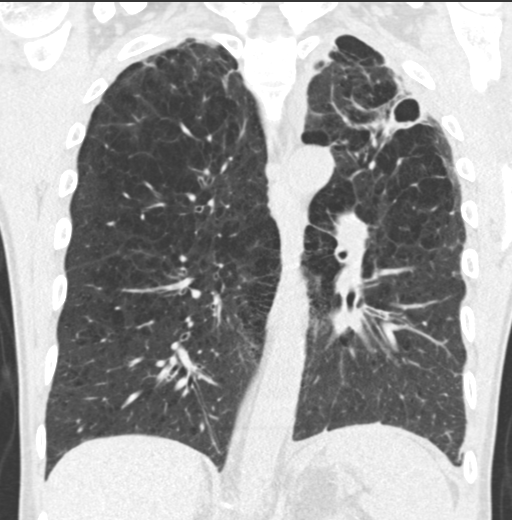

[15 of 36 positions shown; findings below may reference images not displayed]

FINDINGS: Cardiovascular: The heart is normal in size. No pericardial
effusion.

Mild ectasia of the ascending thoracic aorta, measuring 3.4 cm. Mild
atherosclerotic calcifications.

Mediastinum/Nodes: No suspicious mediastinal lymphadenopathy.

Visualized thyroid is unremarkable.

Lungs/Pleura: 2.0 x 1.8 cm cavitary lesion in the left upper lobe
(series 3/ image 35), with a mildly thickened wall but no solid
components. Surrounding scarring in the left upper lobe.

Biapical pleural-parenchymal scarring, left greater than right.

Underlying moderate to severe centrilobular emphysematous changes,
upper lobe predominant.

Trace left pleural effusion.  No pneumothorax.

Upper Abdomen: Visualized upper abdomen is unremarkable.

Musculoskeletal: Visualized osseous structures are within normal
limits.
IMPRESSION: 2.0 x 1.8 cm cavitary lesion in the left upper lobe, with a mildly
thickened wall but without solid components. Consider a follow-up CT
chest in 3-6 months to assess for stability.

## 2018-01-25 ENCOUNTER — Encounter: Payer: Self-pay | Admitting: Internal Medicine

## 2018-01-25 ENCOUNTER — Telehealth: Payer: Self-pay | Admitting: Internal Medicine

## 2018-01-25 NOTE — Telephone Encounter (Signed)
3 attempts to schedule fu appt from recall list.  Mailed Letter. Deleting recall.  ° °
